# Patient Record
Sex: Male | Born: 1960 | Race: White | Marital: Married | Smoking: Never smoker
Health system: Northeastern US, Academic
[De-identification: ages and names within clinical notes are randomized; demographics above are authoritative.]

## PROBLEM LIST (undated history)

## (undated) DIAGNOSIS — K219 Gastro-esophageal reflux disease without esophagitis: Secondary | ICD-10-CM

## (undated) HISTORY — DX: Gastro-esophageal reflux disease without esophagitis: K21.9

## (undated) HISTORY — PX: COLONOSCOPY: SHX174

## (undated) HISTORY — PX: HERNIA REPAIR: SHX51

## (undated) HISTORY — PX: APPENDECTOMY: SHX54

## (undated) HISTORY — PX: EYE SURGERY: SHX253

## (undated) HISTORY — PX: OTHER SURGICAL HISTORY: SHX169

---

## 2015-06-05 ENCOUNTER — Ambulatory Visit
Admission: RE | Admit: 2015-06-05 | Discharge: 2015-06-05 | Disposition: A | Discharge status: Home / Self Care | Payer: Self-pay | Source: Ambulatory Visit | Attending: Dermatology | Admitting: Dermatology

## 2015-06-06 LAB — SURGICAL PATHOLOGY

## 2018-04-01 ENCOUNTER — Encounter: Payer: Self-pay | Admitting: Gastroenterology

## 2018-04-23 ENCOUNTER — Ambulatory Visit: Payer: Commercial Managed Care - PPO

## 2018-04-23 VITALS — BP 177/88 | HR 62 | Ht 69.0 in | Wt 224.2 lb

## 2018-04-23 DIAGNOSIS — H903 Sensorineural hearing loss, bilateral: Secondary | ICD-10-CM

## 2018-04-23 DIAGNOSIS — J3489 Other specified disorders of nose and nasal sinuses: Secondary | ICD-10-CM

## 2018-04-23 DIAGNOSIS — R42 Dizziness and giddiness: Secondary | ICD-10-CM

## 2018-04-23 DIAGNOSIS — H905 Unspecified sensorineural hearing loss: Secondary | ICD-10-CM

## 2018-04-23 DIAGNOSIS — H9313 Tinnitus, bilateral: Secondary | ICD-10-CM

## 2018-04-23 DIAGNOSIS — H61812 Exostosis of left external canal: Secondary | ICD-10-CM

## 2018-04-23 DIAGNOSIS — Z570 Occupational exposure to noise: Secondary | ICD-10-CM

## 2018-04-23 NOTE — Progress Notes (Signed)
[facility]     Chief Complaint:   Dizziness/off balance, hearing loss, tinnitus     HPI:   Dale Cameron is a 58 y.o. male who presents for evaluation of asymmetric hearing loss, dizziness/off balance, and tinnitus.  He notes that about 10 years ago he first started noticing hearing loss and some tinnitus in both ears. He went and had an audiogram, but was told his hearing loss was not significant enough for hearing aids. The tinnitus persisted and he now notes that it has gotten extremely loud. He only has a break from the ringing when he himself is talking, otherwise it is constant and non-pulsatile. He does have a history of noise exposure, as he is a former [employer] officer, and also shoots guns recreationally.     Two years ago, Dale Cameron had an episode where he hadn't been feeling well. He woke up in the morning with terrible night sweats and felt extremely fatigued and nauseous. During this episode, which lasted 12 hours, he had "dizziness"but not room spinning and was unable to stand up straight. He notes that he felt pressure pushing him to the left and this made him lose his balance. In December, the same thing happened, except this lasted on and off, for over a month.  He experienced this unbalanced feeling and leaning to the left once again. This time he went to his PCP, Dr. 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    He decided to make an eye appointment, as he has esotropia. His eye doctor told him that he didn't think the cause of his dizziness was eye related and cleared him from an opthalmology perspective. He did however, have an increase in his prescription and needed new glasses.    He then went and had a formal audiogram at [facility]. 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 During this visit, there was also concern for "white growths" within the left EAC. He notes that he does shoot  guns recreationally and he is a right handed shooter. He also has a history of cold water swimming, being raised on [facility].     He has a history of seasonal allergies, with allergy testing 10+ years ago. He notes he has an allergy to dust mites, but feels this is well controlled with his current medication regimen.     Review of Systems: 12 point review of systems performed.  All pertinent positives include occupational noise exposure, bilateral tinnitus, asymmetric SNHL with the left being worse than the right, dizziness episodes, nausea, bilateral aural fullness,  All pertinent negatives include: room spinning, facial trauma, ENT surgical history     Allergies, medications, PMHx, PSHx, Social Hx, and Family Hx reviewed and documented in medical record.  Pertinent positives included in HPI.      Medical History Past Medical History:   Diagnosis Date    GERD (gastroesophageal reflux disease)       Surgical History Past Surgical History:   Procedure Laterality Date    APPENDECTOMY      COLONOSCOPY      EYE SURGERY      HERNIA REPAIR      groin    trigger finger surgery        Social History Lives with wife, retired [employer] office, works for [employer] office, non smoker, occassional ETOH    Family History Family History  Problem Relation Age of Onset    Heart failure Mother     Heart Disease Mother     High Blood Pressure Mother     Blood clots Father     Cancer Father     Heart failure Maternal Grandmother     Diabetes Maternal Grandmother     Heart failure Maternal Grandfather     Heart failure Paternal Grandmother     Diabetes Paternal Grandmother           Current Medications:  Current Outpatient Medications   Medication    levothyroxine (SYNTHROID, LEVOTHROID) 175 MCG tablet    omeprazole (PRILOSEC) 20 MG capsule    cetirizine (ZYRTEC) 10 MG tablet    cromolyn (NASALCHROM) 5.2 MG/ACT nasal spray     No current facility-administered medications for this visit.        Allergies:    Patient has no known allergies (drug, envir, food or latex).    Objective:   A complete ENMT exam was performed.  The details are as follows:    Vitals Vitals:    04/23/18 1501   BP: 177/88   Pulse: 62   Weight: 101.7 kg (224 lb 3.2 oz)   Height: 1.753 m (5\' 9" )      General:  healthy, alert and appears stated age, in no acute distress    Respiratory:  in no acute distress, breathes easily through his nose   Head and Face:   facial movement was normal and symmetrical, nontender   Eyes:  symmetric, normal ocular motility   External Ears:   normal pinnae shape and position   [place]  AC > BC bilaterally, Weber is midline    Ext. Aud. Canal:  Right:patent    Left: patent, exostosis present     Tympanic Mem:  Right: normal landmarks and mobility   Left: normal landmarks and mobility   Nose:  Nares normal. Septum midline. Mucosa normal. No drainage or sinus tenderness.Nasal mucosa is generally dry.    Oral cavity/pharynx:  Lips, dentition and gingiva within normal for age   Tonsils:  normal appearance   Post. Pharynx:   normal   Neuro:  CN II-XII intact and symmetric. Head impulse test negative. Finger-nose-finger without ataxia. Tandem walk without difficulty. March in place without deviation. Dix-Hallpike negative for nystagmus and did not elicit subjective symptoms    Neck:   no asymmetry, masses, or scars, supple without significant adenopathy     Assessment     I reviewed my findings with Dale Cameron. He has SNHL bilaterally, with the left being worse than the right;  dry nasal mucosa, exostosis of the left EAC and bilateral tinnitus.  Along with dizziness and balance concerns of an unknown etiology. Given his generalized neurological symptoms including loss of balance, dizziness, and in conjunction with his asymmetric hearing loss, I believe an MRI is warranted. He will have this done through [facility].     Plan    Follow up in 6 weeks s/p completion of MRI. He is otherwise medically cleared for hearing  aids.       Lolita Cram, [facility]  [facility]

## 2018-04-27 ENCOUNTER — Telehealth: Payer: Commercial Managed Care - PPO

## 2018-04-27 NOTE — Telephone Encounter (Signed)
Called pt with Radiology appt date & time.          DATE & TIME:  Wed 2.19.20 7PM    Location:  [facility] [address]      Prior Testing/Lab work needed:  [facility]        Instructions:  NPO 2 hrs      Prior Auth: None Needed      Message left-confirmation call requested

## 2018-05-12 LAB — UNMAPPED LAB RESULTS
Hematocrit (HT): 42.9 % — NL (ref 40.0–52.0)
Hemoglobin (HGB) (HT): 15.2 g/dL — NL (ref 13.0–17.5)
MCHC (HT): 35.4 g/dL — NL (ref 32.0–36.0)
MCV (HT): 92.5 FL — NL (ref 81.0–99.0)
Mean Corpuscular Hemoglobin (MCH) (HT): 32.8 pg — NL (ref 26.0–34.0)
Platelets (HT): 207 10 3/uL — NL (ref 140–400)
RBC (HT): 4.64 10 6/uL — NL (ref 4.20–5.90)
RDW (HT): 13 % — NL (ref 11.5–15.0)
WBC (HT): 4.8 10 3/uL — NL (ref 4.0–10.8)

## 2018-05-13 ENCOUNTER — Other Ambulatory Visit: Payer: Self-pay | Admitting: Gastroenterology

## 2018-06-05 ENCOUNTER — Ambulatory Visit: Payer: Commercial Managed Care - PPO

## 2018-06-05 VITALS — BP 147/92 | HR 77 | Ht 69.0 in | Wt 216.0 lb

## 2018-06-05 DIAGNOSIS — R42 Dizziness and giddiness: Secondary | ICD-10-CM

## 2018-06-05 DIAGNOSIS — J3489 Other specified disorders of nose and nasal sinuses: Secondary | ICD-10-CM

## 2018-06-05 DIAGNOSIS — H903 Sensorineural hearing loss, bilateral: Secondary | ICD-10-CM

## 2018-06-05 DIAGNOSIS — H905 Unspecified sensorineural hearing loss: Secondary | ICD-10-CM

## 2018-06-05 NOTE — Progress Notes (Signed)
[facility]     Chief Complaint:   Dizziness/off balance, hearing loss, tinnitus     HPI:   Dale Cameron is a 58 y.o. male who presents for evaluation of asymmetric hearing loss, dizziness/off balance, and tinnitus.  He was seen by me on 04/23/2018 and noted to have asymmetrical hearing loss, with an entirely normal ear exam, CN exam and negative Dix-Hallpike,  Head impulse test negative, Finger-nose-finger and tandem walk without difficulty.  Due to his dizziness combined with tinnitus and assymmetrical hearing loss an MRI of the head was ordered. This test was entirely normal.      Today, the patient notes that he continues to have all day episodes of nausea and feeling "off-balanced," like he is leaning to the right. His last major episode of experiencing this was two days before his MRI.  He says he felt like he was going fall over to the right side. Again, his dizziness is not room spinning, it is moreso a feeling of being off-balanced.     Review of Systems: 12 point review of systems performed.  All pertinent positives include occupational noise exposure, bilateral tinnitus, asymmetric SNHL with the left being worse than the right, dizziness episodes, nausea, bilateral aural fullness,  All pertinent negatives include: room spinning, facial trauma, ENT surgical history     Allergies, medications, PMHx, PSHx, Social Hx, and Family Hx reviewed and documented in medical record.  Pertinent positives included in HPI.      Medical History Past Medical History:   Diagnosis Date    GERD (gastroesophageal reflux disease)       Surgical History Past Surgical History:   Procedure Laterality Date    APPENDECTOMY      COLONOSCOPY      EYE SURGERY      HERNIA REPAIR      groin    trigger finger surgery        Social History Lives with wife, retired [employer] office, works for [employer] office, non smoker, occassional ETOH    Family History Family History   Problem Relation Age of Onset    Heart  failure Mother     Heart Disease Mother     High Blood Pressure Mother     Blood clots Father     Cancer Father     Heart failure Maternal Grandmother     Diabetes Maternal Grandmother     Heart failure Maternal Grandfather     Heart failure Paternal Grandmother     Diabetes Paternal Grandmother           Current Medications:  Current Outpatient Medications   Medication    levothyroxine (SYNTHROID, LEVOTHROID) 175 MCG tablet    omeprazole (PRILOSEC) 20 MG capsule    cetirizine (ZYRTEC) 10 MG tablet    cromolyn (NASALCHROM) 5.2 MG/ACT nasal spray     No current facility-administered medications for this visit.        Allergies:   Patient has no known allergies (drug, envir, food or latex).    Objective:   A complete ENMT exam was performed.  The details are as follows:    Vitals Vitals:    06/05/18 0809   BP: (!) 147/92   Pulse: 77   Weight: 98 kg (216 lb)   Height: 1.753 m (5\' 9" )      General:  healthy, alert and appears stated age, in no acute distress    Respiratory:  in no acute distress, breathes easily through his  nose   Head and Face:   facial movement was normal and symmetrical, nontender   Eyes:  symmetric, normal ocular motility   External Ears:   normal pinnae shape and position   [place]  AC > BC bilaterally, Weber is midline    Ext. Aud. Canal:  Right:patent    Left: patent, exostosis present     Tympanic Mem:  Right: normal landmarks and mobility   Left: normal landmarks and mobility   Nose:  Nares normal. Septum midline. Mucosa normal. No drainage or sinus tenderness.Nasal mucosa is generally dry.    Oral cavity/pharynx:  Lips, dentition and gingiva within normal for age   Tonsils:  normal appearance   Post. Pharynx:   normal   Neuro:  CN II-XII intact and symmetric. Head impulse test negative. Finger-nose-finger without ataxia. Tandem walk without difficulty. March in place without deviation. Dix-Hallpike negative for nystagmus and did not elicit subjective symptoms    Neck:   no  asymmetry, masses, or scars, supple without significant adenopathy     MRI (04/2018): IMPRESSION:   1. No acoustic neuroma/vestibular schwannoma is demonstrated.  2. Whole brain images appear unremarkable.        Assessment     I reviewed my findings with Dale Cameron. He has SNHL bilaterally, with the left being worse than the right;  dry nasal mucosa, exostosis of the left EAC and bilateral tinnitus.  Along with dizziness and balance concerns of an unknown etiology.    He was provided with a sinonasal rinse bottle to help with his dry nasal mucosa.     The MRI obtained was unremarkable, this was discussed with the patient. We discussed obtaining a VNG to definitively rule out any vestibular cause for his dizziness and nausea. He has been referred to neurology in the meantime. I suspect that his dizziness and his feeling of being off balanced may be multifactorial: hormonal fluctuation, blood pressure regulation and possible atypical migraine.      Plan    Follow up 2 months to discuss results of VNG, again, he has been cleared for bilateral amplification if he is interested.       Lolita Cram, [facility]  [facility]

## 2018-07-07 ENCOUNTER — Ambulatory Visit: Payer: Commercial Managed Care - PPO | Attending: Family Medicine | Admitting: Audiology

## 2018-07-07 DIAGNOSIS — R42 Dizziness and giddiness: Secondary | ICD-10-CM | POA: Insufficient documentation

## 2018-07-07 NOTE — Progress Notes (Signed)
BALANCE EVALUATION  Videonystagmography (VNG)     Audiology  Part of [facility] [address] [place], [address]  [place],  [place] [address]  Phone: [phone], Fax: [phone]     Outpatient Visit  Patient: Dale Cameron   MR Number: [id number]   Date of Birth: [birth year]   Date of Visit: 07/07/2018       CLINICAL INDICATIONS:  Patient referred by Lolita Cram, NP for VNG testing. Patient reports dizziness that started two and a half years ago.  He reports his original episode of dizziness lasted 6-7 weeks. During that time, he felt that he was "in second gear" and was "leaning to the left".  He reports that his dizziness went away; however, he has been experiencing it each day for an hour or so in the morning after waking up.  He reports he had another longer episode in January 2020.  He now reports he has it once every few days.  He reports difficulty with his eyes and reports poor depth perception and "switches eyes" when looking at things or focusing.  He has been seen by his eye doctor recently and reports that his eyeglasses prescription changed slightly.  He has a history of migraines which occur often.  He reports his migraines are usually brought on by bright lights and he experiences auras with them.  He also reports a family history of migraines and reports he does not take any medication for his migraines. He occasionally has a headache in the morning when experiencing his dizziness.  He had an MRI completed recently which was unremarkable. He reportedly will be seen by Neurology soon. Previous audiometric testing dated 04/23/2018 from [facility] revealed mild sloping to severe high frequency sensorineural hearing loss, left ear, and a mild sloping to moderately-severe mid to high frequency sensorineural hearing loss, right ear. An asymmetry was noted between ears in the high frequencies, LE > RE.  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 Tympanograms are suggestive of normal Eustachian tube function, bilaterally. Today, otoscopy revealed clear canals and visible tympanic membrane pre caloric irrigations, bilaterally.    VNG RESULTS:  Oculomotor:   Random saccades, optokinetics, and visual pursuit testing all borderline outside normal limits.  Of note, patient reported during testing that he was "switching eyes"   when following visual stimuli.  Abnormal oculomotor testing may suggest possible central component to dizziness.   Gaze:   No gaze-evoked nystagmus was observed.  Spontaneous:  No spontaneous nystagmus was observed.  Dix-Hallpike:  The Dix-Hallpike for posterior canal benign paroxysmal positional vertigo was negative to both sides. No subjective vertigo was reported.  Positional: No nystagmus was observed in the head positions.  Head Shake: No nystagmus was observed post-head shake.  Calorics: Bithermal caloric air irrigations produced robust and symmetrical nystagmus (5% interaural difference). Caloric fixation suppression was normal.    Peak SPV per caloric condition    Right Cool = 36 deg/sec Left Warm = 47 deg/sec   Right Warm = 56 deg/sec Left Cool = 37 deg/sec     Caloric Weakness = Left Ear Response 5% weaker   Directional Preponderance = Right beating response 6% stronger.      CALORIC IMPRESSION: The caloric responses suggest do not suggest a bilateral or unilateral peripheral weakness.      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     RECOMMENDATIONS:  1. Follow-up with Lolita CramMargaret Mannellino, NP.       Darden DatesJennifer C. Charlesetta Shankshomson, Au.D., M.S., CCC-A, F-AAA   Clinical Audiologist   [facility]

## 2018-08-03 ENCOUNTER — Ambulatory Visit: Payer: Commercial Managed Care - PPO

## 2018-08-05 ENCOUNTER — Ambulatory Visit: Payer: Commercial Managed Care - PPO | Admitting: Otolaryngology

## 2018-08-05 ENCOUNTER — Encounter: Payer: Self-pay | Admitting: Otolaryngology

## 2018-08-05 VITALS — BP 164/89 | HR 74 | Ht 69.0 in | Wt 227.0 lb

## 2018-08-05 DIAGNOSIS — R42 Dizziness and giddiness: Secondary | ICD-10-CM

## 2018-08-05 DIAGNOSIS — H903 Sensorineural hearing loss, bilateral: Secondary | ICD-10-CM

## 2018-08-05 DIAGNOSIS — H905 Unspecified sensorineural hearing loss: Secondary | ICD-10-CM

## 2018-08-05 NOTE — Progress Notes (Signed)
Patient was seen in follow-up today for balance issues.  Symptoms previously summarized in nurse practitioner note.  Has documented mild asymmetric high frequency nerve hearing loss with previous negative MRI.  Overall he states that his balance symptoms and sense of being off balance and associated nausea have resolved.  He denies any positional triggers her activities that will result in increased balance symptoms.  Hearing has not fluctuated.  Has baseline tinnitus..    Has prior history of migraine primarily visual in nature.      Denies any recent hospitalization or surgery.    Outpatient Medications Marked as Taking for the 08/05/18 encounter (Office Visit) with Haywood Filler, MD   Medication Sig Dispense Refill    levothyroxine (SYNTHROID, LEVOTHROID) 175 MCG tablet Take 175 mcg by mouth daily (before breakfast)      omeprazole (PRILOSEC) 20 MG capsule Take 20 mg by mouth daily (before breakfast)      cetirizine (ZYRTEC) 10 MG tablet Take 10 mg by mouth daily      cromolyn (NASALCHROM) 5.2 MG/ACT nasal spray 1 spray by Nasal route 4 times daily         BP 164/89    Pulse 74    Ht 1.753 m (5\' 9" )    Wt 103 kg (227 lb)    BMI 33.52 kg/m     Patient is in no acute distress.  His gait is normal.  Hearing satisfactory a quiet room.  The ear canals are free of wax . Tympanic membranes intact and no middle ear effusion is noted. Mastoid is non tender.  Cranial nerves intact (some long-term component of right lazy eye).  No nystagmus.  March and Romberg testing are negative.  Head shake maneuvers do not elicit nystagmus or symptoms  Nasal / oral cavity mucosa free of any traction.  Neck free of any adenopathy or thyroid findings.    VNG testing reviewed.  No evidence of peripheral vestibulopathy.  Previous MRI and audiogram also reviewed prior to patient visit.    Stable examination.  Resolution of presenting complaints.  Review of chart completed.  No additional suggestions and ENT standpoint.  I did review  the possibility of vestibular migraine as a cause for his problems.  Encouraged to keep a diary if any symptoms arise to see if there is any dietary factors that may contribute to the problem.  He is scheduled to see neurology in the near future which I think is reasonable to rule out any other central neurologic process.  I will be happy to see him back acutely for any deterioration arises.  He is comfortable with this approach.  Please feel free to contact me with any questions.  Thank you for allowing me to participate in this patient's care.    Sincerely,      Verlin Grills, MD  Chief of Otolaryngology, [facility]  Dictated but not read on [employer]

## 2019-02-11 ENCOUNTER — Encounter: Payer: Self-pay | Admitting: Otolaryngology

## 2019-02-11 ENCOUNTER — Ambulatory Visit: Payer: Commercial Managed Care - PPO | Admitting: Otolaryngology

## 2019-02-11 VITALS — BP 163/80 | HR 60 | Ht 69.0 in | Wt 232.0 lb

## 2019-02-11 DIAGNOSIS — R0981 Nasal congestion: Secondary | ICD-10-CM

## 2019-02-11 DIAGNOSIS — J342 Deviated nasal septum: Secondary | ICD-10-CM

## 2019-02-17 ENCOUNTER — Encounter: Payer: Self-pay | Admitting: Otolaryngology

## 2019-02-17 NOTE — Progress Notes (Signed)
Patient was seen in follow-up today for concerns regarding nasal symptoms.  Patient planes of nasal congestion, right side greater than left.  Has associated postnasal drip and sense of mouth breathing.  Allergy work-up in the past showed some problems with grass.  Denies any nasal bleeding history.  Sense of smell is fair.  No prior history of trauma.    He has tried Zyrtec and Nasalcrom without any improvement in symptoms.  Was told by PCP he had a deviated septum.    Has been seen in the past for balance issues and asymmetric hearing loss.  These are unchanged..      Denies any recent hospitalization or surgery.    Outpatient Medications Marked as Taking for the 02/11/19 encounter (Office Visit) with Greggory Keen, MD   Medication Sig Dispense Refill    losartan (COZAAR) 100 MG tablet Take 100 mg by mouth daily      levothyroxine (SYNTHROID, LEVOTHROID) 175 MCG tablet Take 175 mcg by mouth daily (before breakfast)      omeprazole (PRILOSEC) 20 MG capsule Take 20 mg by mouth daily (before breakfast)      cetirizine (ZYRTEC) 10 MG tablet Take 10 mg by mouth daily      cromolyn (NASALCHROM) 5.2 MG/ACT nasal spray 1 spray by Nasal route 4 times daily         BP 163/80    Pulse 60    Ht 1.753 m (5\' 9" )    Wt 105.2 kg (232 lb)    BMI 34.26 kg/m     Patient is in no acute distress.  Sinuses nontender.  Posterior pharynx free of any drainage or asymmetry.  Anterior anoscopy reveals pink mucosa.  No significant turbinate congestion.  There is moderate to severe right septal deviation impinging towards the turbinate.  Nasal endoscopy shows no evidence of polyps or lesion deep in the nasal cavity.    Previous MRI scan done for asymmetric hearing loss was reviewed.  Confirms the presence of the septal deviation although this was not the intent of the imaging.    Patient with nasal related complaints.  Clearly has septal deviation that accounts for mechanical obstruction and would relate to his symptoms.  Findings  reviewed with patient.  Offered the option of septoplasty.  Procedure risks and complications reviewed with the patient.  These include infection and risk of septal perforation.  He would like to pursue this option as he feels that his symptoms do affect his daily quality life.  Scheduling will be done at his discretion.    Please feel free to contact me with any questions.  Thank you for allowing me to participate in this patient's care.    Sincerely,      Belinda Block, MD  Chief of Otolaryngology, [employer] [facility]  Dictated but not read on [employer]

## 2019-03-10 ENCOUNTER — Telehealth: Payer: Self-pay | Admitting: Otolaryngology

## 2019-03-10 NOTE — Telephone Encounter (Signed)
[facility]    Type:  SEPTO    Date:  3.22.21 (CANCELLED 12.28.20 PER COVID SURGE)    Prior Auth:  NOT NEEDED    FUV:  FUV SCHED.      Notes:

## 2019-03-30 ENCOUNTER — Ambulatory Visit: Payer: Commercial Managed Care - PPO | Admitting: Otolaryngology

## 2019-05-31 ENCOUNTER — Telehealth: Payer: Self-pay | Admitting: Otolaryngology

## 2019-05-31 NOTE — Telephone Encounter (Signed)
Call from pt. His surgery was resched to March 22 due to Covid. Pt wants to know if surgery is still expected to happen for this date.    Call pt [phone]

## 2019-05-31 NOTE — Telephone Encounter (Signed)
Left message for pt that surgeries have not been canceled and reminded him of the Covid screening and [facility] pretesting.

## 2019-06-09 ENCOUNTER — Other Ambulatory Visit: Payer: Self-pay | Admitting: Otolaryngology

## 2019-06-10 ENCOUNTER — Other Ambulatory Visit: Payer: Self-pay | Admitting: Otolaryngology

## 2019-06-10 DIAGNOSIS — J342 Deviated nasal septum: Secondary | ICD-10-CM

## 2019-06-10 LAB — COVID-19 NAAT (PCR): COVID-19 NAAT (PCR): NOT DETECTED

## 2019-06-10 LAB — COVID-19 PCR

## 2019-06-10 MED ORDER — CEPHALEXIN 500 MG PO CAPS *I*
500.0000 mg | ORAL_CAPSULE | Freq: Two times a day (BID) | ORAL | 0 refills | Status: AC
Start: 2019-06-10 — End: 2019-06-17

## 2019-06-10 NOTE — Progress Notes (Signed)
eRecord imaging/testing/medications ordering process initiated per provider request; to be reviewed and signed at provider discretion

## 2019-06-14 ENCOUNTER — Encounter: Payer: Self-pay | Admitting: Gastroenterology

## 2019-06-14 ENCOUNTER — Encounter: Payer: Commercial Managed Care - PPO | Admitting: Otolaryngology

## 2019-06-14 DIAGNOSIS — J3489 Other specified disorders of nose and nasal sinuses: Secondary | ICD-10-CM

## 2019-06-14 DIAGNOSIS — J342 Deviated nasal septum: Secondary | ICD-10-CM

## 2019-06-22 ENCOUNTER — Ambulatory Visit: Payer: Commercial Managed Care - PPO | Admitting: Otolaryngology

## 2019-06-22 ENCOUNTER — Encounter: Payer: Self-pay | Admitting: Otolaryngology

## 2019-06-22 VITALS — BP 151/78 | HR 65 | Ht 69.0 in | Wt 228.0 lb

## 2019-06-22 DIAGNOSIS — J342 Deviated nasal septum: Secondary | ICD-10-CM

## 2019-06-22 NOTE — Progress Notes (Signed)
Patient was seen in followup today after undergoing septoplasty one week ago.  Antibiotic treatment has been completed.  There has been no significant bleeding.    On examination, septal splints were removed.  Underlying mucosal membranes are intact with no evidence of perforation.  Position of the septum appears satisfactory.  There is no evidence of infection.    Patient was instructed to use saline sprays 4 times a day.  Followup in 3 months to assess long-term results.    Please feel free to contact me with any questions.  Thank you for allowing me to participate in this patient's care.    Sincerely,      Carol Theys, MD  Chief of Otolaryngology, [facility]  Dictated but not read on [employer]

## 2019-09-15 ENCOUNTER — Ambulatory Visit: Payer: Commercial Managed Care - PPO | Admitting: Otolaryngology

## 2019-09-15 ENCOUNTER — Encounter: Payer: Self-pay | Admitting: Otolaryngology

## 2019-09-15 VITALS — BP 149/81 | HR 64 | Ht 69.0 in | Wt 224.0 lb

## 2019-09-15 DIAGNOSIS — J342 Deviated nasal septum: Secondary | ICD-10-CM

## 2019-09-15 NOTE — Progress Notes (Signed)
Patient was seen in follow-up today.  He is now 3 months status post septoplasty.  Overall health status updated.  No recent hospitalization or surgery.  Denies any nasal bleeding.  Smell is intact.  He does have seasonal allergies-uses Zyrtec year-round and Flonase on a seasonal basis.  No recent antibiotics or facial pain.    Overall there is improvement in his nasal airflow function.    Outpatient Medications Marked as Taking for the 09/15/19 encounter (Office Visit) with Haywood Filler, MD   Medication Sig Dispense Refill    losartan (COZAAR) 100 MG tablet Take 100 mg by mouth daily      levothyroxine (SYNTHROID, LEVOTHROID) 175 MCG tablet Take 175 mcg by mouth daily (before breakfast)      omeprazole (PRILOSEC) 20 MG capsule Take 20 mg by mouth daily (before breakfast)      cetirizine (ZYRTEC) 10 MG tablet Take 10 mg by mouth daily      cromolyn (NASALCHROM) 5.2 MG/ACT nasal spray 1 spray by Nasal route 4 times daily         BP 149/81    Pulse 64    Ht 1.753 m (5\' 9" )    Wt 101.6 kg (224 lb)    BMI 33.08 kg/m     He is in no acute distress.  No facial swelling or tenderness.  Posterior pharynx free of any drainage.  Anterior rhinoscopy reveals pink mucosa with no evidence of septal perforation.  Septal position is satisfactory reduction of the right posterior obstructing spur.  There is no evidence of polyps or other infection.  He does have some mild nasal valve collapse with deep inspiration.    Stable exam.  Satisfactory healing.  Functionally, nasal airway is improved.  He does have some mild nasal valve collapse which could contribute to his symptoms but do not feel any he needs to be done for that.  He will follow-up as needed.  Please feel free to contact me with any questions.  Thank you for allowing me to participate in this patient's care.    Sincerely,      , MD  Chief of Otolaryngology, [facility]  Dictated but not read on [facility]

## 2020-12-13 IMAGING — MR MRI LUMBAR SPINE WITHOUT CONTRAST
4 of 7 series · 18 of 48 positions shown · IV contrast (gadolinium)
Comparison: None.

________________________________________________________________________________________________ 
MRI LUMBAR SPINE WITHOUT CONTRAST, 12/13/2020 [DATE]: 
CLINICAL INDICATION: Chronic low back pain. Increased in the last 2 months.
TECHNIQUE: Multiplanar, multiecho position MR images of the lumbar spine were 
performed without intravenous gadolinium enhancement. Patient was scanned on a 
1.5T magnet.

[Series 2: T2 · coronal · 5.0mm · 0.55mm/px · 5 of 15 slices shown (1 of 3)]
[im 1/15]
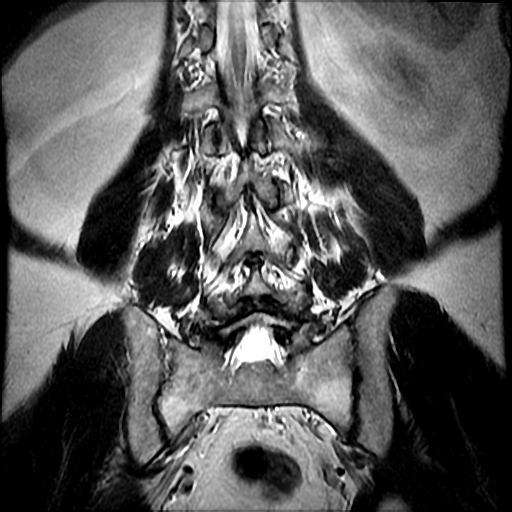
[im 4/15]
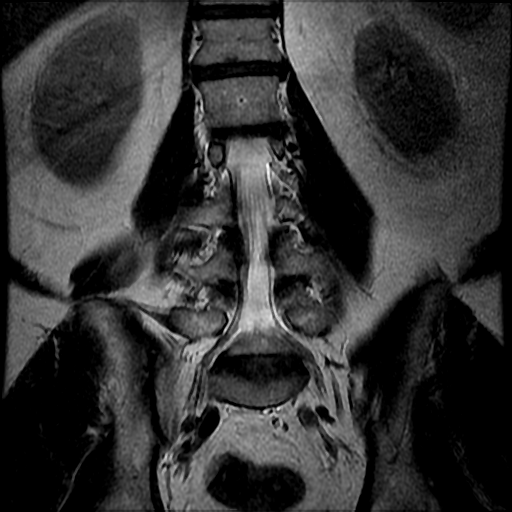
[im 8/15]
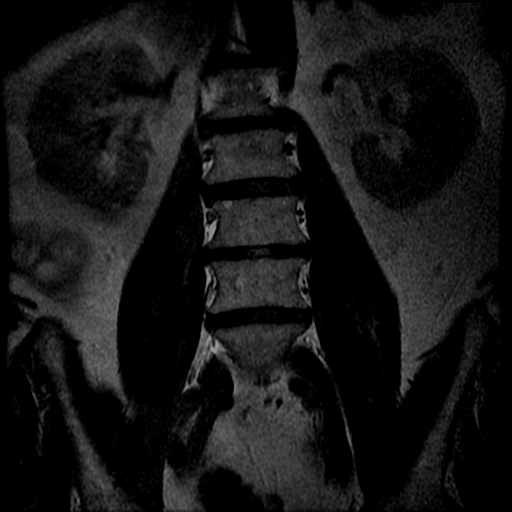
[im 11/15]
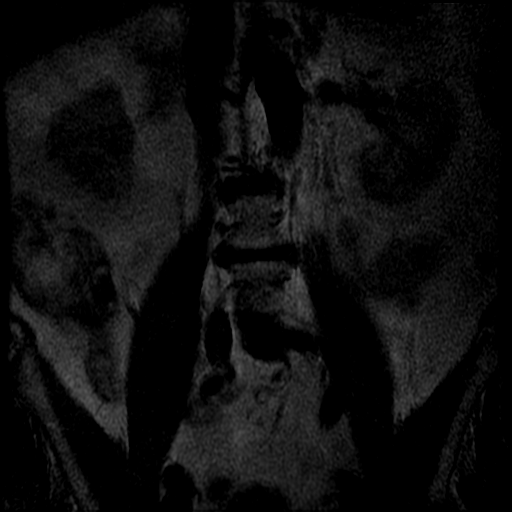
[im 15/15]
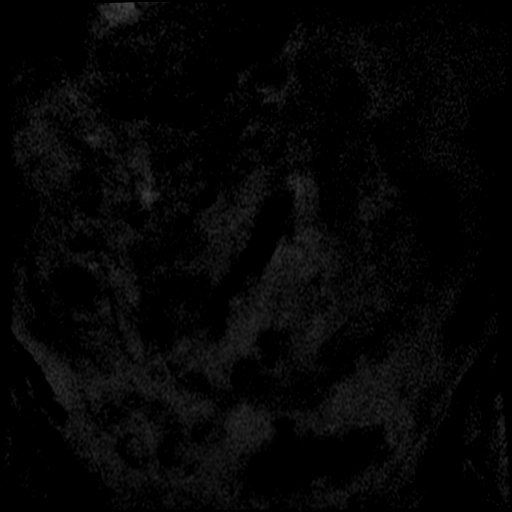

[Series 3: T1 · sagittal · 4.0mm · 0.51mm/px · 3 of 17 slices shown]
[im 4/17]
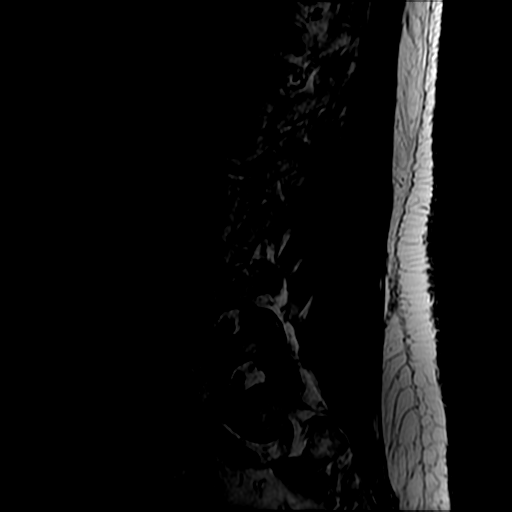
[im 10/17]
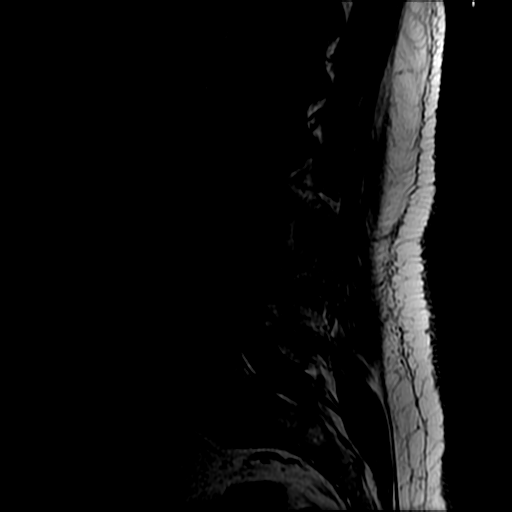
[im 17/17]
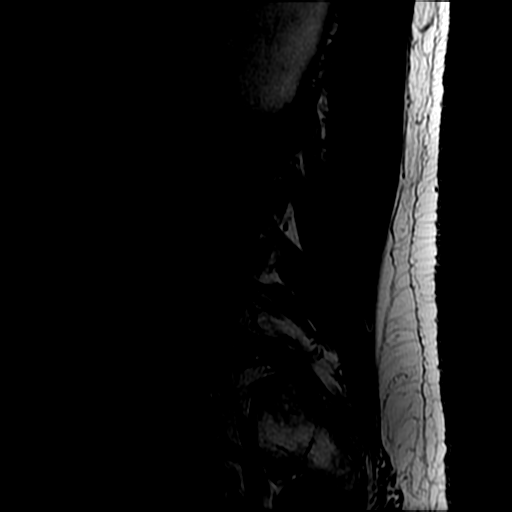

[Series 4: T2 · sagittal · 4.0mm · 0.51mm/px · 6 of 17 slices shown (2 of 3)]
[im 1/17]
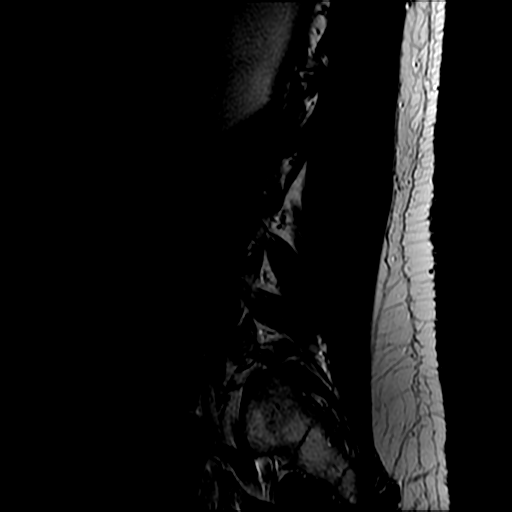
[im 4/17]
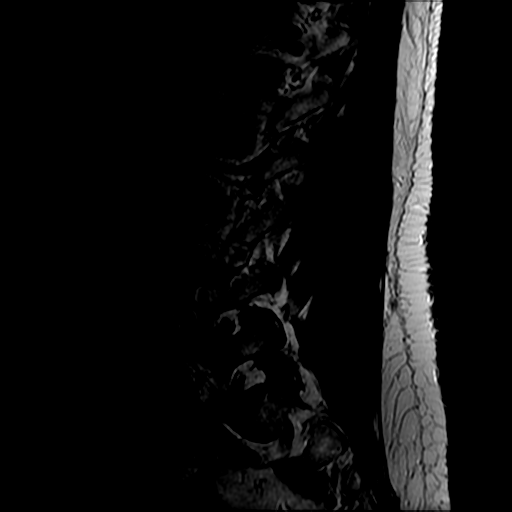
[im 7/17]
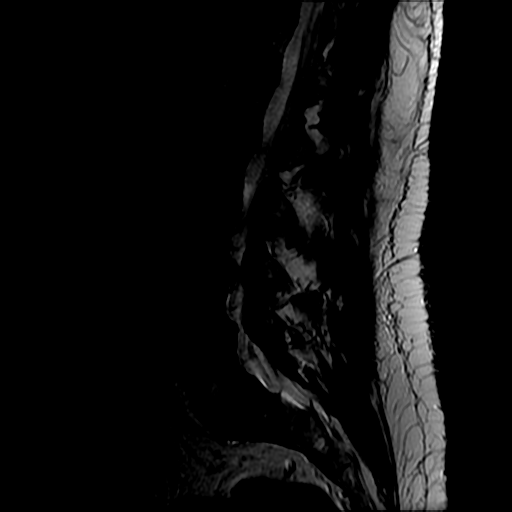
[im 10/17]
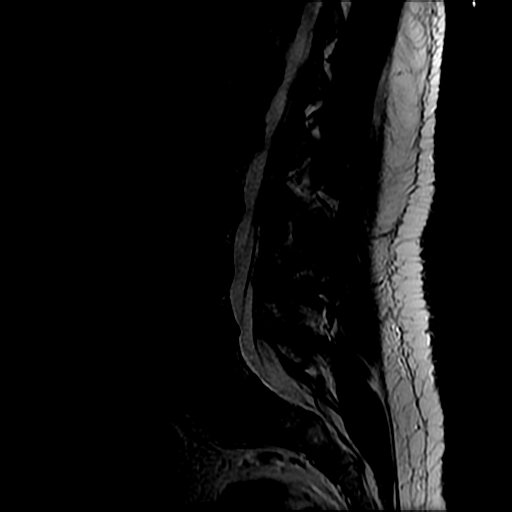
[im 13/17]
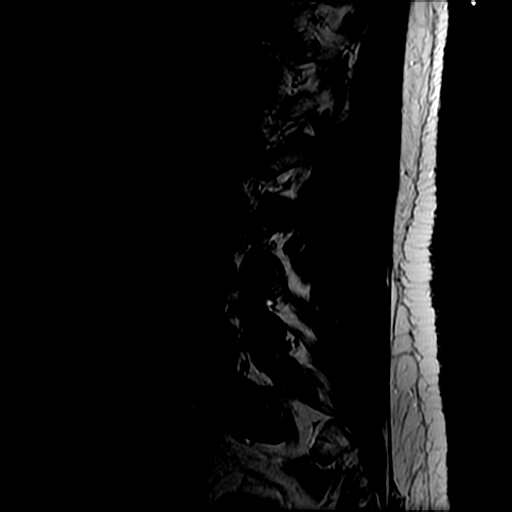
[im 17/17]
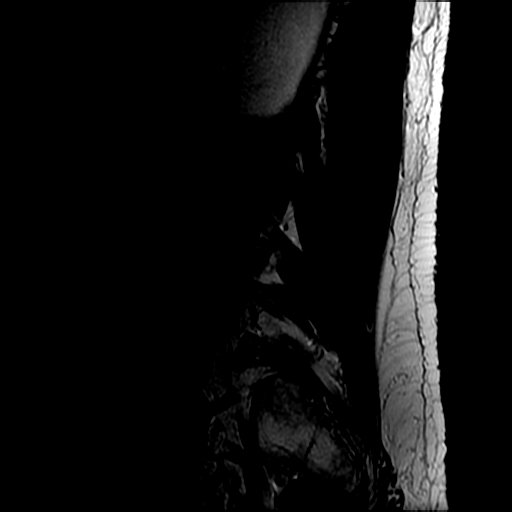

[Series 6: T2 · oblique · 4.0mm · 0.35mm/px · 4 of 30 slices shown (3 of 3)]
[im 1/30]
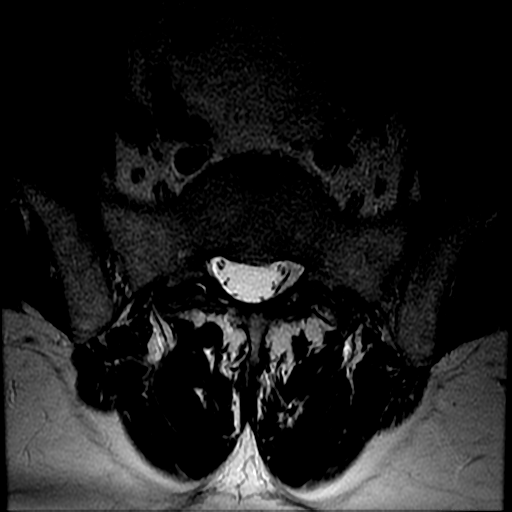
[im 4/30]
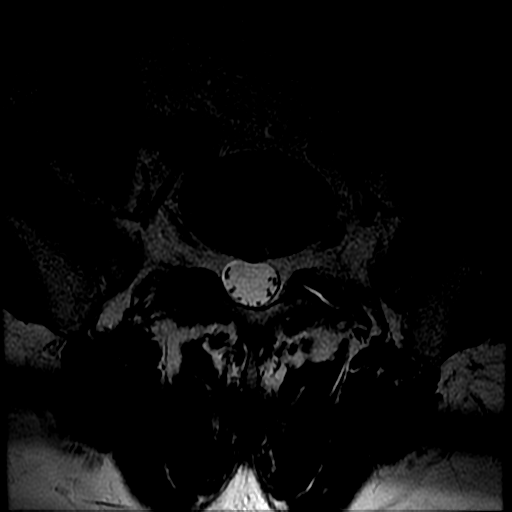
[im 17/30]
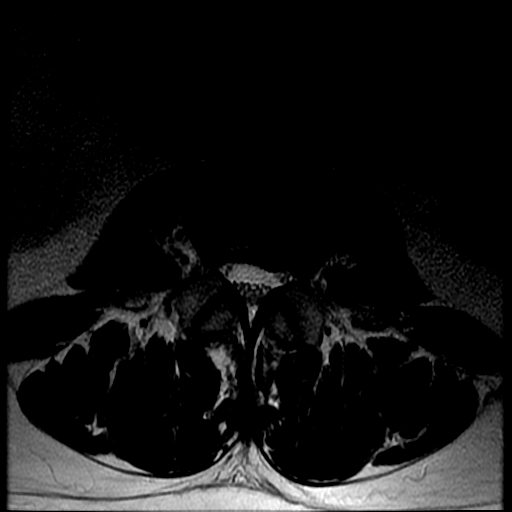
[im 26/30]
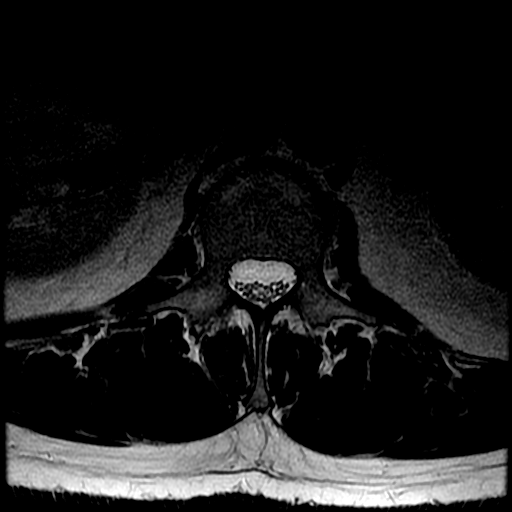

[18 of 48 positions shown; findings below may reference images not displayed]

FINDINGS: --------------------------------------------------------------------------- 
General: 
Lumbar spine alignment demonstrates mild retrolisthesis of L1 and L2. Minimal 
retrolisthesis of L3 on L4. No focal suspect marrow signal abnormality. There is 
an atypical hemangioma in the L4 vertebral body. Mild active endplate change at 
the L3-L4 level. Conus medullaris is normal in size and signal intensity, 
terminating at L1 superior endplate level. Visualized extraspinal soft tissues 
are unremarkable. 
Modic I-II levels: L3-L4. 
Ligamentum flavum > 2.5 mm levels: All levels. 
--------------------------------------------------------------------------- 
Segmental: 
T12-L1: No significant central canal or neural foraminal narrowing. 
L1-L2: Disc bulge eccentric to the left side. Schmorls node along the superior 
endplate of L2. No significant central canal or neural foraminal narrowing.  
L2-L3: Disc bulge eccentric to the left side with left central disc herniation 
extending inferiorly. Mild bilateral facet hypertrophy. Mild left-sided central 
canal narrowing. No significant neural foraminal narrowing. 
L3-L4: Disc bulge with right subarticular disc herniation. Bilateral facet 
hypertrophy. No significant central canal narrowing. Moderate right subarticular 
recess narrowing. No significant left neural foraminal narrowing. Minimal right 
neural foraminal narrowing. 
L4-L5: Disc bulge. Bilateral facet hypertrophy. No significant central canal 
narrowing. Mild left neural foraminal narrowing. No significant right neural 
foraminal narrowing. 
L5-S1: Bilateral facet hypertrophy. Fluid in the left facet joint. No 
significant central canal or neural foraminal narrowing.  
---------------------------------------------------------------------------
IMPRESSION: 1.  Discogenic/degenerative changes as above. 
2.  No moderate or severe central canal narrowing. 
3.  Moderate right subarticular recess narrowing at L3-L4. 
4.  No moderate or severe neural foraminal narrowing.

## 2021-08-07 IMAGING — MR MRI LEFT KNEE WITHOUT CONTRAST
5 of 6 series · 22 of 40 positions shown · IV contrast (gadolinium)
Comparison: None

________________________________________________________________________________________________ 
MRI LEFT KNEE WITHOUT CONTRAST, 08/07/2021 [DATE]: 
CLINICAL INDICATION: ARTHROPATHY LEFT KNEE.
TECHNIQUE: Multiplanar, multiecho position MR images of the knee were performed 
without intravenous gadolinium enhancement. Patient was scanned on a
magnet.

[Series 101: survey_fullfov_transversal · axial · 10.0mm · 1.84mm/px · 1 of 7 slices shown]
[im 1/7]
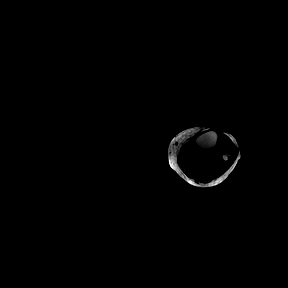

[Series 201: survey_ · axial · 10.0mm · 0.78mm/px · z∈[-20,+125]mm · 2 of 9 slices shown]
[im 1/9]
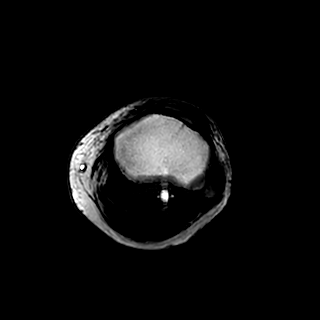
[im 9/9]
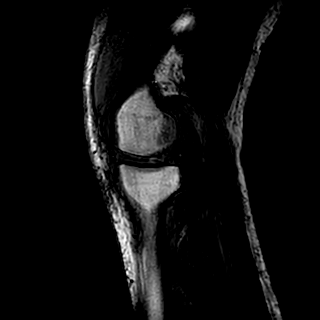

[Series 301: (person_name)_(person_name)_(person_name) · axial · 3.0mm · 0.35mm/px · z∈[-63,+66]mm · 8 of 38 slices shown]
[im 1/38]
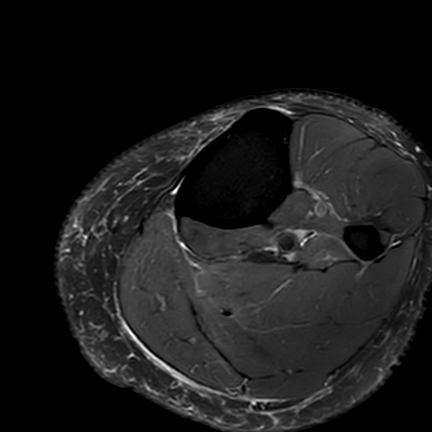
[im 5/38]
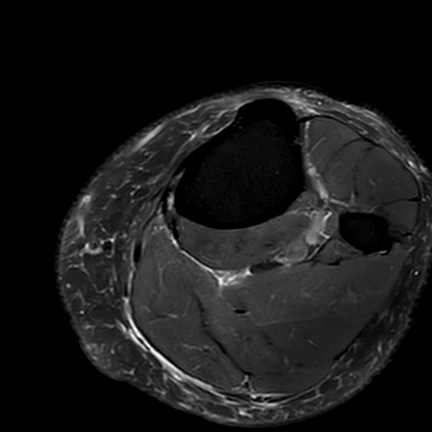
[im 13/38]
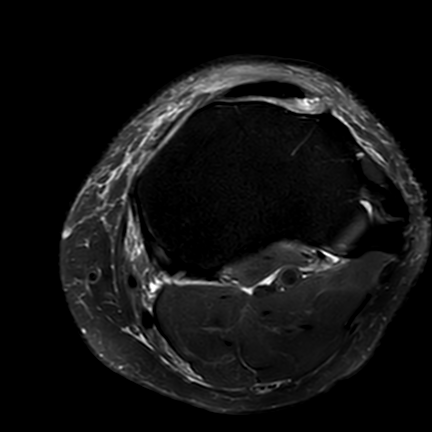
[im 17/38]
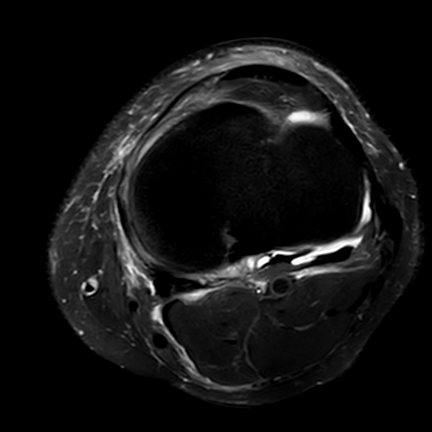
[im 21/38]
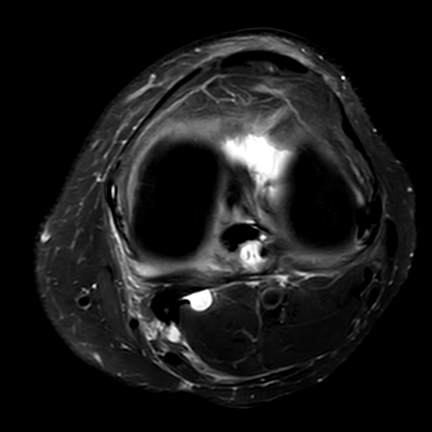
[im 25/38]
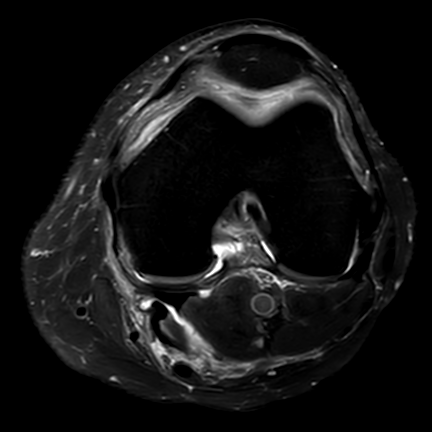
[im 33/38]
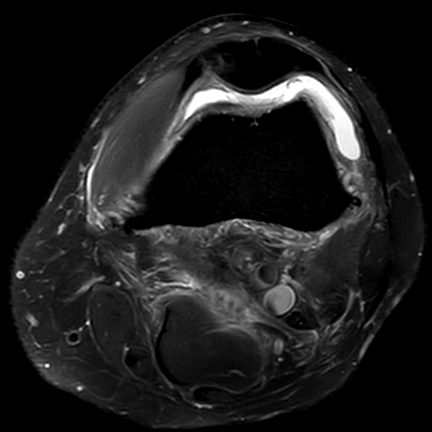
[im 38/38]
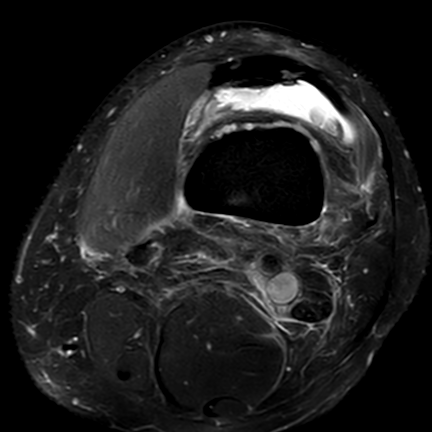

[Series 401: pd_fs_sag fh · sagittal · 3.0mm · 0.28mm/px · 9 of 35 slices shown]
[im 1/35]
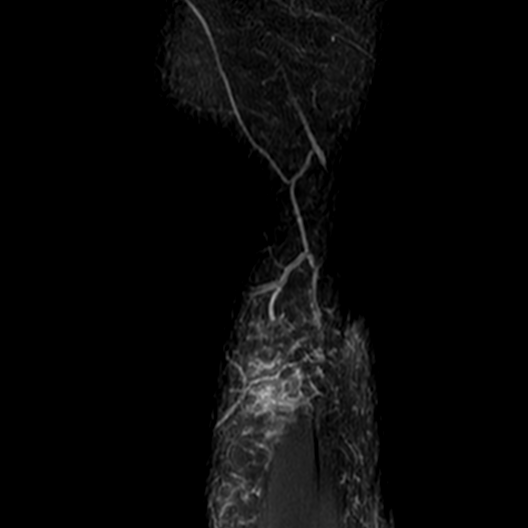
[im 5/35]
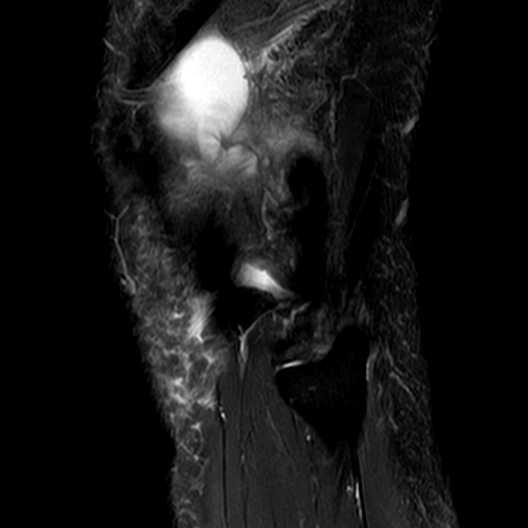
[im 9/35]
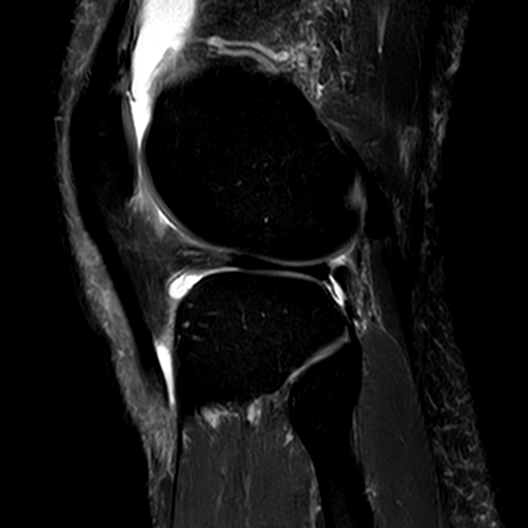
[im 13/35]
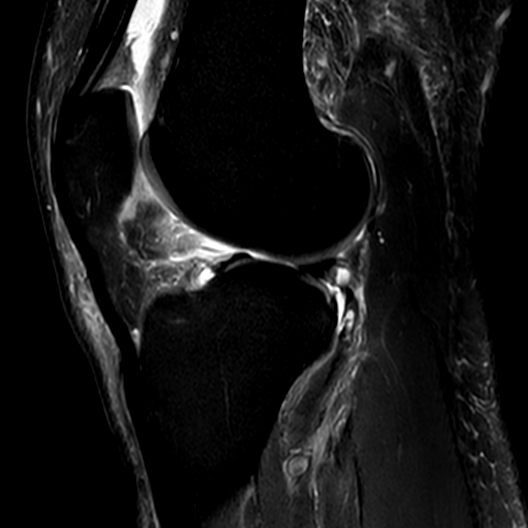
[im 18/35]
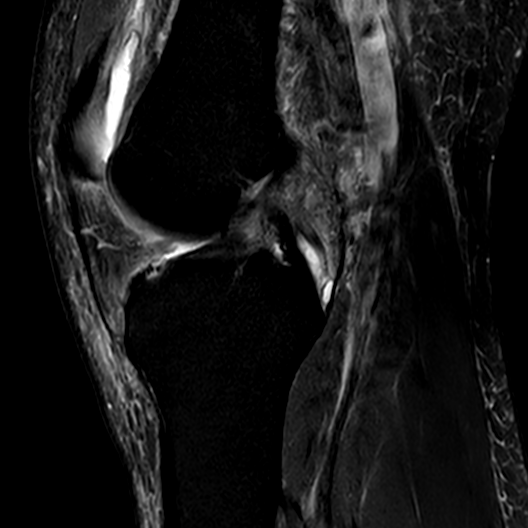
[im 22/35]
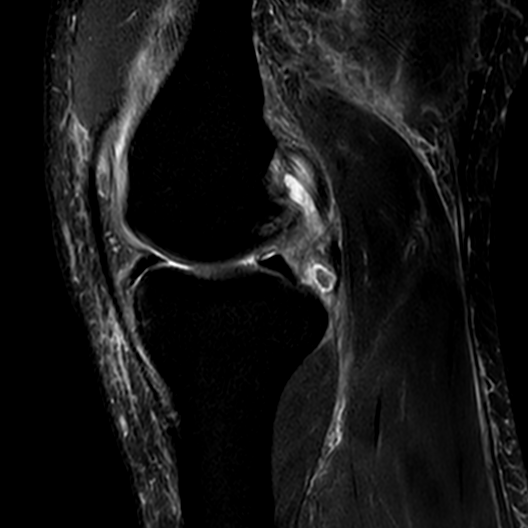
[im 26/35]
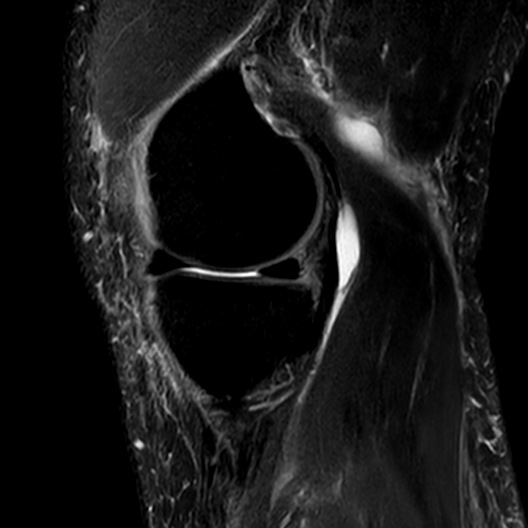
[im 30/35]
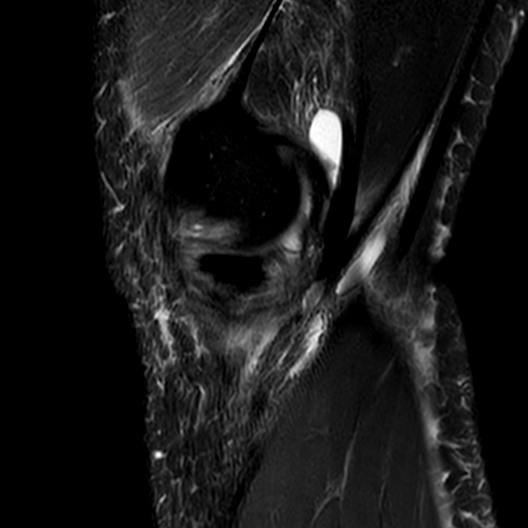
[im 35/35]
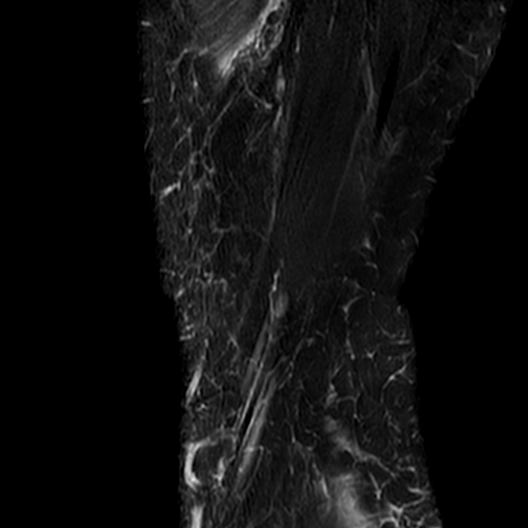

[Series 501: t1_cor · coronal · 3.0mm · 0.31mm/px · 2 of 35 slices shown]
[im 1/35]
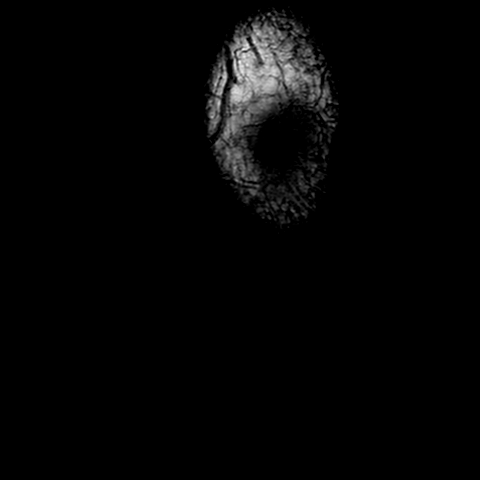
[im 5/35]
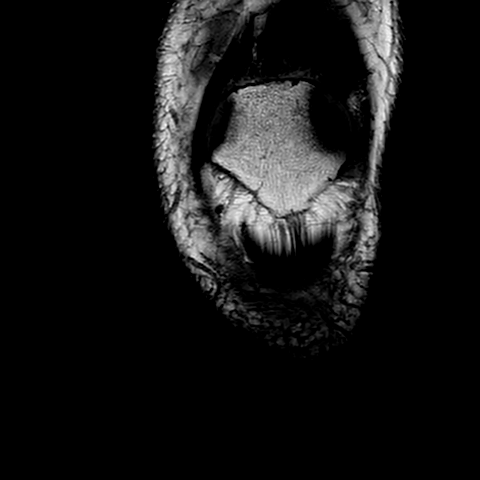

[22 of 40 positions shown; findings below may reference images not displayed]

FINDINGS: MEDIAL COMPARTMENT: Horizontal tear of the posterior horn and body of the medial 
meniscus with truncation of the inner margin and 3 mm medial meniscal extrusion. 
Up to grade III chondromalacia of the medial femoral condyle. Small 
intra-articular bodies in the posterior medial recess measure up to 0.6 cm. 
LATERAL COMPARTMENT: The lateral meniscus is intact without tear or extrusion. 
Articular cartilage is preserved. 
PATELLOFEMORAL COMPARTMENT: The patella is centrally located. Up to grade II 
chondromalacia.  
TIBIOFIBULAR COMPARTMENT: Negative. 
LIGAMENTS: The anterior cruciate ligament is intact. The posterior cruciate 
ligament is intact. The medial collateral ligament and lateral collateral 
ligaments are preserved. 
EXTENSOR MECHANISM: The quadriceps and patellar tendon are preserved. The medial 
and lateral retinacula are intact. 
POSTEROMEDIAL CORNER: The semimembranosus and pes anserine tendons are 
preserved. The posterior oblique ligament and posterior medial joint capsule are 
intact. 
POSTEROLATERAL CORNER: The popliteal tendon and popliteofibular ligament are 
intact. The biceps femoris is negative. 
BONES: Normal bone marrow signal intensity. No fracture or contusion or stress 
response.  
ADDITIONAL FINDINGS: Large knee joint effusion. 2.8 x 0.8 x 5.3 cm ruptured 
popliteal cyst with adjacent dissecting fluid. Deep and subcutaneous soft tissue 
swelling. The musculature is normal without mass, signal abnormality or atrophy. 
Neurovascular bundles are negative.
IMPRESSION: 1.  Horizontal tear of the posterior horn/body of the medial meniscus with 
truncation of the inner margin and medial meniscal extrusion 
2.  Moderate medial compartment and mild patellofemoral joint chondromalacia. 
3.  Large knee joint effusion, ruptured popliteal cyst with adjacent dissecting 
fluid and deep/subcutaneous soft tissue swelling.

## 8386-11-24 DEATH — deceased
